# Patient Record
Sex: Male | Born: 1995
Health system: Southern US, Community
[De-identification: ages and names within clinical notes are randomized; demographics above are authoritative.]

## PROBLEM LIST (undated history)

## (undated) DIAGNOSIS — F1110 Opioid abuse, uncomplicated: Secondary | ICD-10-CM

---
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
## 2014-10-31 ENCOUNTER — Emergency Department (HOSPITAL_COMMUNITY)
Admission: EM | Admit: 2014-10-31 | Discharge: 2014-10-31 | Disposition: A | Discharge status: Home / Self Care | Payer: Medicaid Other | Attending: Emergency Medicine | Admitting: Emergency Medicine

## 2014-10-31 ENCOUNTER — Encounter (HOSPITAL_COMMUNITY): Payer: Self-pay | Admitting: Neurology

## 2014-10-31 DIAGNOSIS — T401X1A Poisoning by heroin, accidental (unintentional), initial encounter: Secondary | ICD-10-CM | POA: Insufficient documentation

## 2014-10-31 DIAGNOSIS — Z720 Tobacco use: Secondary | ICD-10-CM | POA: Insufficient documentation

## 2014-10-31 DIAGNOSIS — F1110 Opioid abuse, uncomplicated: Secondary | ICD-10-CM | POA: Diagnosis present

## 2014-10-31 HISTORY — DX: Opioid abuse, uncomplicated: F11.10

## 2014-10-31 NOTE — Discharge Instructions (Signed)
Narcotic Overdose  A narcotic overdose is the misuse or overuse of a narcotic drug. A narcotic overdose can make you pass out and stop breathing. If you are not treated right away, this can cause permanent brain damage or stop your heart. Medicine may be given to reverse the effects of an overdose. If so, this medicine may bring on withdrawal symptoms. The symptoms may be abdominal cramps, throwing up (vomiting), sweating, chills, and nervousness.  Injecting narcotics can cause more problems than just an overdose. AIDS, hepatitis, and other very serious infections are transmitted by sharing needles and syringes. If you decide to quit using, there are medicines which can help you through the withdrawal period. Trying to quit all at once on your own can be uncomfortable, but not life-threatening. Call your caregiver, Narcotics Anonymous, or any drug and alcohol treatment program for further help.   Document Released: 04/22/2004 Document Revised: 06/07/2011 Document Reviewed: 02/14/2009  [employer]® Patient Information ©2015 [employer]. This information is not intended to replace advice given to you by your health care provider. Make sure you discuss any questions you have with your health care provider.

## 2014-10-31 NOTE — ED Notes (Signed)
Per ems- pt reports he injected heroin this morning, was found unconscious when ems arrived with HR 120. Given 1 mg narcan, is now alert and oriented, ambulatory.

## 2014-10-31 NOTE — ED Provider Notes (Addendum)
CSN: [id number]     Arrival date & time 10/31/14  1259 History   First MD Initiated Contact with Patient 10/31/14 1304     Chief Complaint  Patient presents with  . Drug Overdose    HPI Patient admits to using heroin earlier this morning.  He was found by EMS Neck and unresponsive.  He was given a milligram of Narcan is now alert.  He has no complaints.  He denies coingestions.  He reports this was recreational use only.  He has no intent of harming himself.   Past Medical History  Diagnosis Date  . Heroin abuse    History reviewed. No pertinent past surgical history. No family history on file. History  Substance Use Topics  . Smoking status: Current Every Day Smoker  . Smokeless tobacco: Not on file  . Alcohol Use: Yes    Review of Systems  All other systems reviewed and are negative.     Allergies  Review of patient's allergies indicates no known allergies.  Home Medications   Prior to Admission medications   Not on File   BP 101/87 mmHg  Pulse 103  Temp(Src) 98 F (36.7 C) (Oral)  Resp 14  Ht  (1.676 m)  Wt 120 lb (54.432 kg)  BMI 19.38 kg/m2  SpO2 97% Physical Exam  Constitutional: He is oriented to person, place, and time. He appears well-developed and well-nourished.  HENT:  Head: Normocephalic and atraumatic.  Eyes: EOM are normal.  Neck: Normal range of motion.  Cardiovascular: Normal rate, regular rhythm, normal heart sounds and intact distal pulses.   Pulmonary/Chest: Effort normal and breath sounds normal. No respiratory distress.  Abdominal: Soft. He exhibits no distension. There is no tenderness.  Musculoskeletal: Normal range of motion.  Neurological: He is alert and oriented to person, place, and time.  Skin: Skin is warm and dry.  Psychiatric: He has a normal mood and affect. Judgment normal.  Nursing note and vitals reviewed.   ED Course  Procedures (including critical care time) Labs Review Labs Reviewed - No data to  display  Imaging Review No results found.  ECG interpretation   Date: 10/31/2014  Rate: 115  Rhythm: sinus tachycardia  QRS Axis: normal  Intervals: normal  ST/T Wave abnormalities: normal  Conduction Disutrbances: none  Narrative Interpretation:   Old EKG Reviewed: No significant changes noted     MDM   Final diagnoses:  Heroin overdose, accidental or unintentional, initial encounter    Patient was observed in the emergency department.  He's had no apneic episodes or any other complaints.  He remains alert and oriented.    Azalia Bilis, MD 10/31/14 1402  Azalia Bilis, MD 10/31/14 [phone]

## 2014-12-09 IMAGING — CR LEFT INDEX FINGER 2+V
1 series · 3 of 3 positions shown · non-contrast
Comparison: none

REASON FOR EXAM: jammed in [address] 89[REDACTED]
COMMENTS:

PROCEDURE:     DXR - DXR FINGER INDEX 2ND DIGIT LT HA  - September 16, 2012 [DATE]
RESULT:     Left second digit dated 09/16/2012

[Series 1: x finger pa left · 0.14mm/px · 3 of 3 slices shown]
[im 1/3]
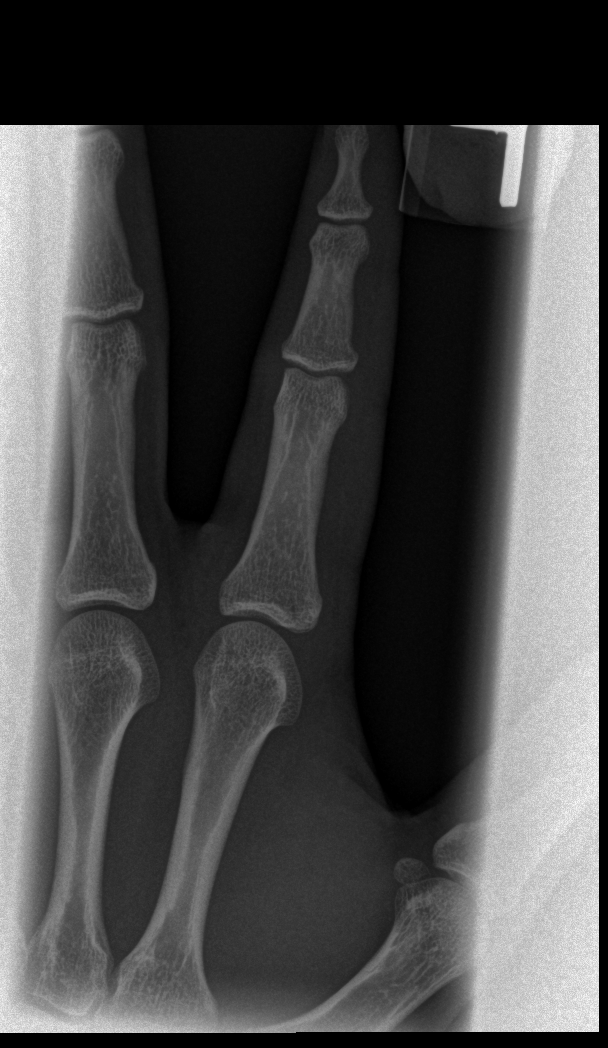
[im 2/3]
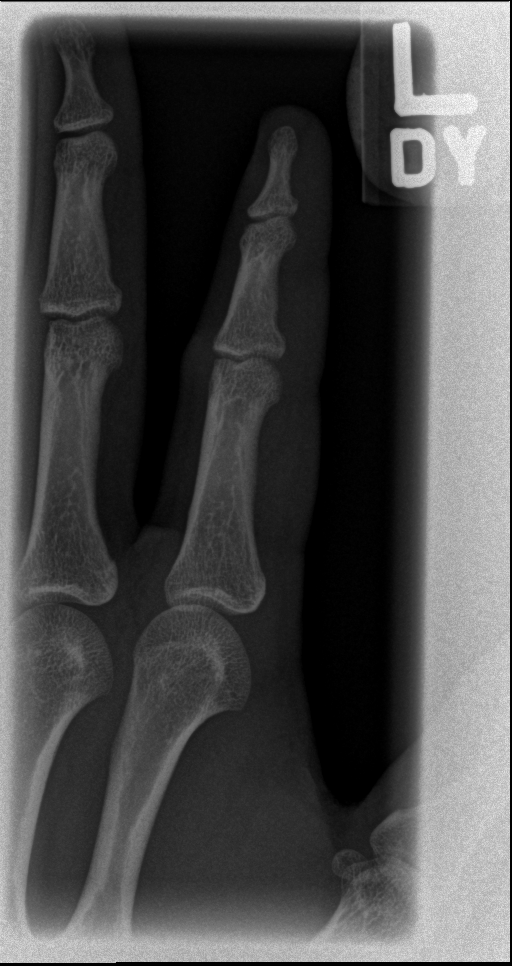
[im 3/3]
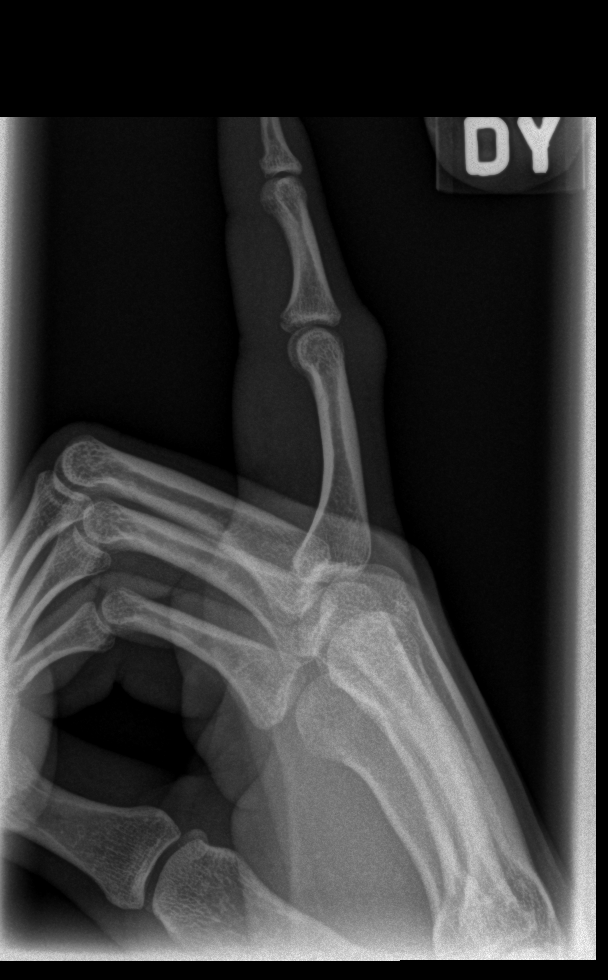

[3 of 3 positions shown; findings below may reference images not displayed]

FINDINGS: A nondisplaced fracture is identified along the volar base of the
middle phalanx. There is intra-articular extension.
IMPRESSION: Nondisplaced fracture along the volar base of the middle
phalanx.

## 2017-08-20 ENCOUNTER — Ambulatory Visit (HOSPITAL_COMMUNITY)
Admission: EM | Admit: 2017-08-20 | Discharge: 2017-08-20 | Disposition: A | Discharge status: Home / Self Care | Payer: Self-pay | Attending: Family Medicine | Admitting: Family Medicine

## 2017-08-20 ENCOUNTER — Other Ambulatory Visit: Payer: Self-pay

## 2017-08-20 ENCOUNTER — Encounter (HOSPITAL_COMMUNITY): Payer: Self-pay | Admitting: *Deleted

## 2017-08-20 DIAGNOSIS — L0292 Furuncle, unspecified: Secondary | ICD-10-CM

## 2017-08-20 MED ORDER — DOXYCYCLINE HYCLATE 100 MG PO TABS: 100.0000 mg | ORAL_TABLET | Freq: Two times a day (BID) | ORAL | 0 refills | Status: AC

## 2017-08-20 NOTE — ED Triage Notes (Signed)
C/O "bumps on my butt and above my butt" x approx 1 month without pain or pruritis.

## 2017-08-20 NOTE — ED Provider Notes (Signed)
[facility]   [id number] 08/20/17 Arrival Time: 1556   SUBJECTIVE:  James Cox is a 22 y.o. male who presents to the urgent care with complaint of "bumps on my butt and above my butt" x approx 1 month without pain or pruritis.  Patient works [employer].  Past Medical History:  Diagnosis Date  . Heroin abuse (HCC)    Family History  Problem Relation Age of Onset  . Healthy Mother   . Healthy Father    Social History   Socioeconomic History  . Marital status: Single    Spouse name: Not on file  . Number of children: Not on file  . Years of education: Not on file  . Highest education level: Not on file  Occupational History  . Not on file  Social Needs  . Financial resource strain: Not on file  . Food insecurity:    Worry: Not on file    Inability: Not on file  . Transportation needs:    Medical: Not on file    Non-medical: Not on file  Tobacco Use  . Smoking status: Current Every Day Smoker  . Smokeless tobacco: Never Used  Substance and Sexual Activity  . Alcohol use: Not Currently  . Drug use: Not Currently    Comment: former heroin use  . Sexual activity: Yes    Birth control/protection: None  Lifestyle  . Physical activity:    Days per week: Not on file    Minutes per session: Not on file  . Stress: Not on file  Relationships  . Social connections:    Talks on phone: Not on file    Gets together: Not on file    Attends religious service: Not on file    Active member of club or organization: Not on file    Attends meetings of clubs or organizations: Not on file    Relationship status: Not on file  . Intimate partner violence:    Fear of current or ex partner: Not on file    Emotionally abused: Not on file    Physically abused: Not on file    Forced sexual activity: Not on file  Other Topics Concern  . Not on file  Social History Narrative  . Not on file   No outpatient medications have been marked as taking for the 08/20/17  encounter [facility] Encounter).   No Known Allergies    ROS: As per HPI, remainder of ROS negative.   OBJECTIVE:   Vitals:   08/20/17 1641  BP: 115/67  Pulse: 61  Resp: 16  Temp: 98.2 F (36.8 C)  TempSrc: Oral  SpO2: 100%     General appearance: alert; no distress Eyes: PERRL; EOMI; conjunctiva normal HENT: normocephalic; atraumatic;  oral mucosa normal Neck: supple Back: no CVA tenderness Extremities: no cyanosis or edema; symmetrical with no gross deformities Skin: warm and dry; pustular eruption over glutes Neurologic: normal gait; grossly normal Psychological: alert and cooperative; normal mood and affect     ASSESSMENT & PLAN:  1. Furunculosis   Wash the area daily with soap and water   Note:  all brands of soap are antibacterial.  Meds ordered this encounter  Medications  . doxycycline (VIBRA-TABS) 100 MG tablet    Sig: Take 1 tablet (100 mg total) by mouth 2 (two) times daily.    Dispense:  20 tablet    Refill:  0    Reviewed expectations re: course of current medical issues. Questions answered. Outlined signs and  symptoms indicating need for more acute intervention. Patient verbalized understanding. After Visit Summary given.    Procedures:      Elvina Sidle, MD 08/20/17 [phone]

## 2017-08-20 NOTE — Discharge Instructions (Addendum)
Wash the area daily with soap and water   Note:  all brands of soap are antibacterial.

## 2017-12-01 NOTE — [facility]reg[facility] Nurse Program (Signed)
New client.  Discussed orange card application and referred to DSS for application process

## 2020-01-10 ENCOUNTER — Other Ambulatory Visit: Payer: Self-pay

## 2020-01-10 DIAGNOSIS — Z20822 Contact with and (suspected) exposure to covid-19: Secondary | ICD-10-CM

## 2020-01-11 LAB — NOVEL CORONAVIRUS, NAA: SARS-CoV-2, NAA: NOT DETECTED

## 2020-01-11 LAB — SARS-COV-2, NAA 2 DAY TAT

## 2020-05-12 ENCOUNTER — Encounter (HOSPITAL_COMMUNITY): Payer: Self-pay | Admitting: *Deleted
